# Patient Record
Sex: Male | Born: 1970 | Race: White | Hispanic: No | Marital: Single | Smoking: Current every day smoker
Health system: Southern US, Community
[De-identification: ages and names within clinical notes are randomized; demographics above are authoritative.]

## PROBLEM LIST (undated history)

## (undated) DIAGNOSIS — M5030 Other cervical disc degeneration, unspecified cervical region: Secondary | ICD-10-CM

## (undated) DIAGNOSIS — F419 Anxiety disorder, unspecified: Secondary | ICD-10-CM

## (undated) DIAGNOSIS — M1990 Unspecified osteoarthritis, unspecified site: Secondary | ICD-10-CM

## (undated) DIAGNOSIS — G4700 Insomnia, unspecified: Secondary | ICD-10-CM

## (undated) DIAGNOSIS — K759 Inflammatory liver disease, unspecified: Secondary | ICD-10-CM

## (undated) HISTORY — PX: NO PAST SURGERIES: SHX2092

---

## 2015-06-20 ENCOUNTER — Emergency Department (HOSPITAL_COMMUNITY)
Admission: EM | Admit: 2015-06-20 | Discharge: 2015-06-20 | Disposition: A | Discharge status: Other Acute Inpatient Hospital | Payer: No Typology Code available for payment source | Attending: Emergency Medicine | Admitting: Emergency Medicine

## 2015-06-20 ENCOUNTER — Encounter (HOSPITAL_COMMUNITY): Payer: Self-pay | Admitting: *Deleted

## 2015-06-20 ENCOUNTER — Inpatient Hospital Stay (HOSPITAL_COMMUNITY)
Admission: AD | Admit: 2015-06-20 | Discharge: 2015-06-22 | DRG: 885 | Disposition: A | Discharge status: Home / Self Care | Payer: Federal, State, Local not specified - Other | Source: Intra-hospital | Attending: Psychiatry | Admitting: Psychiatry

## 2015-06-20 ENCOUNTER — Emergency Department (HOSPITAL_COMMUNITY): Payer: No Typology Code available for payment source

## 2015-06-20 ENCOUNTER — Encounter (HOSPITAL_COMMUNITY): Payer: Self-pay | Admitting: Emergency Medicine

## 2015-06-20 DIAGNOSIS — S0990XA Unspecified injury of head, initial encounter: Secondary | ICD-10-CM | POA: Diagnosis present

## 2015-06-20 DIAGNOSIS — S40211A Abrasion of right shoulder, initial encounter: Secondary | ICD-10-CM | POA: Diagnosis not present

## 2015-06-20 DIAGNOSIS — F10929 Alcohol use, unspecified with intoxication, unspecified: Secondary | ICD-10-CM

## 2015-06-20 DIAGNOSIS — F10129 Alcohol abuse with intoxication, unspecified: Secondary | ICD-10-CM | POA: Diagnosis present

## 2015-06-20 DIAGNOSIS — F1010 Alcohol abuse, uncomplicated: Secondary | ICD-10-CM | POA: Insufficient documentation

## 2015-06-20 DIAGNOSIS — Y9389 Activity, other specified: Secondary | ICD-10-CM | POA: Insufficient documentation

## 2015-06-20 DIAGNOSIS — F1994 Other psychoactive substance use, unspecified with psychoactive substance-induced mood disorder: Secondary | ICD-10-CM | POA: Diagnosis present

## 2015-06-20 DIAGNOSIS — Y998 Other external cause status: Secondary | ICD-10-CM | POA: Diagnosis not present

## 2015-06-20 DIAGNOSIS — F329 Major depressive disorder, single episode, unspecified: Secondary | ICD-10-CM | POA: Insufficient documentation

## 2015-06-20 DIAGNOSIS — F332 Major depressive disorder, recurrent severe without psychotic features: Secondary | ICD-10-CM | POA: Diagnosis not present

## 2015-06-20 DIAGNOSIS — Z915 Personal history of self-harm: Secondary | ICD-10-CM

## 2015-06-20 DIAGNOSIS — F32A Depression, unspecified: Secondary | ICD-10-CM

## 2015-06-20 DIAGNOSIS — F17210 Nicotine dependence, cigarettes, uncomplicated: Secondary | ICD-10-CM | POA: Diagnosis present

## 2015-06-20 DIAGNOSIS — Y92410 Unspecified street and highway as the place of occurrence of the external cause: Secondary | ICD-10-CM | POA: Diagnosis not present

## 2015-06-20 DIAGNOSIS — Y908 Blood alcohol level of 240 mg/100 ml or more: Secondary | ICD-10-CM | POA: Diagnosis present

## 2015-06-20 DIAGNOSIS — X838XXA Intentional self-harm by other specified means, initial encounter: Secondary | ICD-10-CM

## 2015-06-20 LAB — COMPREHENSIVE METABOLIC PANEL
ALT: 20 U/L (ref 17–63)
AST: 27 U/L (ref 15–41)
Albumin: 4.4 g/dL (ref 3.5–5.0)
Alkaline Phosphatase: 57 U/L (ref 38–126)
Anion gap: 11 (ref 5–15)
BUN: 7 mg/dL (ref 6–20)
CHLORIDE: 105 mmol/L (ref 101–111)
CO2: 22 mmol/L (ref 22–32)
CREATININE: 0.75 mg/dL (ref 0.61–1.24)
Calcium: 8.8 mg/dL — ABNORMAL LOW (ref 8.9–10.3)
Glucose, Bld: 98 mg/dL (ref 65–99)
POTASSIUM: 3.5 mmol/L (ref 3.5–5.1)
SODIUM: 138 mmol/L (ref 135–145)
Total Bilirubin: 0.5 mg/dL (ref 0.3–1.2)
Total Protein: 7.1 g/dL (ref 6.5–8.1)

## 2015-06-20 LAB — URINALYSIS, ROUTINE W REFLEX MICROSCOPIC
Bilirubin Urine: NEGATIVE
GLUCOSE, UA: NEGATIVE mg/dL
Hgb urine dipstick: NEGATIVE
KETONES UR: NEGATIVE mg/dL
LEUKOCYTES UA: NEGATIVE
NITRITE: NEGATIVE
PH: 5.5 (ref 5.0–8.0)
Protein, ur: NEGATIVE mg/dL
SPECIFIC GRAVITY, URINE: 1.002 — AB (ref 1.005–1.030)
Urobilinogen, UA: 0.2 mg/dL (ref 0.0–1.0)

## 2015-06-20 LAB — CBC WITH DIFFERENTIAL/PLATELET
BASOS ABS: 0.1 10*3/uL (ref 0.0–0.1)
Basophils Relative: 1 % (ref 0–1)
EOS PCT: 4 % (ref 0–5)
Eosinophils Absolute: 0.2 10*3/uL (ref 0.0–0.7)
HEMATOCRIT: 42.3 % (ref 39.0–52.0)
Hemoglobin: 15.2 g/dL (ref 13.0–17.0)
LYMPHS ABS: 1.6 10*3/uL (ref 0.7–4.0)
LYMPHS PCT: 27 % (ref 12–46)
MCH: 32.2 pg (ref 26.0–34.0)
MCHC: 35.9 g/dL (ref 30.0–36.0)
MCV: 89.6 fL (ref 78.0–100.0)
Monocytes Absolute: 0.5 10*3/uL (ref 0.1–1.0)
Monocytes Relative: 8 % (ref 3–12)
NEUTROS ABS: 3.7 10*3/uL (ref 1.7–7.7)
Neutrophils Relative %: 60 % (ref 43–77)
PLATELETS: 194 10*3/uL (ref 150–400)
RBC: 4.72 MIL/uL (ref 4.22–5.81)
RDW: 13.6 % (ref 11.5–15.5)
WBC: 6 10*3/uL (ref 4.0–10.5)

## 2015-06-20 LAB — RAPID URINE DRUG SCREEN, HOSP PERFORMED
AMPHETAMINES: NOT DETECTED
BARBITURATES: NOT DETECTED
BENZODIAZEPINES: NOT DETECTED
COCAINE: NOT DETECTED
Opiates: NOT DETECTED
TETRAHYDROCANNABINOL: NOT DETECTED

## 2015-06-20 LAB — ETHANOL: ALCOHOL ETHYL (B): 258 mg/dL — AB (ref <5)

## 2015-06-20 MED ORDER — TRAZODONE HCL 50 MG PO TABS
50.0000 mg | ORAL_TABLET | Freq: Every evening | ORAL | Status: DC | PRN
  Administered 2015-06-20 – 2015-06-21 (×2): 50 mg via ORAL
  Filled 2015-06-20 (×2): qty 1

## 2015-06-20 MED ORDER — NICOTINE 21 MG/24HR TD PT24
21.0000 mg | MEDICATED_PATCH | Freq: Every day | TRANSDERMAL | Status: DC
  Administered 2015-06-20: 21 mg via TRANSDERMAL
  Filled 2015-06-20 (×2): qty 1

## 2015-06-20 MED ORDER — ACETAMINOPHEN 325 MG PO TABS: 650.0000 mg | ORAL_TABLET | ORAL | Status: DC | PRN

## 2015-06-20 MED ORDER — ONDANSETRON HCL 4 MG PO TABS: 4.0000 mg | ORAL_TABLET | Freq: Three times a day (TID) | ORAL | Status: DC | PRN

## 2015-06-20 MED ORDER — THIAMINE HCL 100 MG/ML IJ SOLN: 100.0000 mg | Freq: Every day | INTRAMUSCULAR | Status: DC

## 2015-06-20 MED ORDER — BUTALBITAL-APAP-CAFFEINE 50-325-40 MG PO TABS
2.0000 | ORAL_TABLET | ORAL | Status: DC | PRN
  Filled 2015-06-20: qty 2

## 2015-06-20 MED ORDER — ALUM & MAG HYDROXIDE-SIMETH 200-200-20 MG/5ML PO SUSP: 30.0000 mL | ORAL | Status: DC | PRN

## 2015-06-20 MED ORDER — IBUPROFEN 400 MG PO TABS
600.0000 mg | ORAL_TABLET | Freq: Three times a day (TID) | ORAL | Status: DC | PRN
  Administered 2015-06-20: 600 mg via ORAL
  Filled 2015-06-20 (×2): qty 1

## 2015-06-20 MED ORDER — LORAZEPAM 1 MG PO TABS: 0.0000 mg | ORAL_TABLET | Freq: Two times a day (BID) | ORAL | Status: DC

## 2015-06-20 MED ORDER — IBUPROFEN 600 MG PO TABS
600.0000 mg | ORAL_TABLET | Freq: Four times a day (QID) | ORAL | Status: DC | PRN
  Administered 2015-06-20 – 2015-06-21 (×4): 600 mg via ORAL
  Filled 2015-06-20 (×4): qty 1

## 2015-06-20 MED ORDER — MAGNESIUM HYDROXIDE 400 MG/5ML PO SUSP: 30.0000 mL | Freq: Every day | ORAL | Status: DC | PRN

## 2015-06-20 MED ORDER — LORAZEPAM 1 MG PO TABS
0.0000 mg | ORAL_TABLET | Freq: Four times a day (QID) | ORAL | Status: DC
  Filled 2015-06-20: qty 1

## 2015-06-20 MED ORDER — VITAMIN B-1 100 MG PO TABS: 100.0000 mg | ORAL_TABLET | Freq: Every day | ORAL | Status: DC

## 2015-06-20 MED ORDER — IBUPROFEN 400 MG PO TABS
600.0000 mg | ORAL_TABLET | Freq: Once | ORAL | Status: DC
  Filled 2015-06-20: qty 1
  Filled 2015-06-20: qty 3
  Filled 2015-06-20: qty 1

## 2015-06-20 NOTE — ED Notes (Signed)
This RN has called [facility] Office to arrange transport of patient to [facility].

## 2015-06-20 NOTE — ED Notes (Signed)
Patient appears to be asleep; sitter at bedside 
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
## 2015-06-20 NOTE — ED Notes (Signed)
Sitter has returned from break; patient laying on Human resources officer television; sitter at bedside

## 2015-06-20 NOTE — ED Notes (Signed)
Patient denied any pain medication.

## 2015-06-20 NOTE — ED Notes (Signed)
TTS at bedside. 

## 2015-06-20 NOTE — ED Notes (Signed)
Patient is now back in room; again yelling at sitter; [employer] now speaking with patient

## 2015-06-20 NOTE — ED Notes (Signed)
Sister just left from visiting with patient; patient sitting on stretcher, watching television; sitter at bedside

## 2015-06-20 NOTE — ED Notes (Signed)
Pt refused to cooperate with VS with the NT.  This RN went to bedside with security and [employer] available in room.This RN advised pt that cooperation would be appreciated.  This RN advised pt that VS are required q 6h and not optional.  Pt allowed this RN to take VS.  When asked pain score pt replied, "It doesn't matter anyway."  This RN thanked pt for cooperating.

## 2015-06-20 NOTE — ED Notes (Signed)
Lunch tray has arrived; sitter at bedside 

## 2015-06-20 NOTE — ED Notes (Signed)
Patient given snacks and a cup of coke with ice to drink

## 2015-06-20 NOTE — ED Notes (Signed)
Patient sitting on stretcher; sitter at bedside

## 2015-06-20 NOTE — ED Notes (Signed)
Marylu Lund from [facility] called to inform this RN that this patient has been accepted to [facility], 307 bed 2 by Dr. Dub Mikes. The pt and Dr. Ethelda Chick informed.

## 2015-06-20 NOTE — ED Notes (Signed)
TTS set up at bedside. 

## 2015-06-20 NOTE — Progress Notes (Addendum)
Seeking inpatient placement for pt. Also considered for admission to [facility] upon bed availability.  Referred to: [facility]- per Karoline Caldwell, at capacity currently but can review for waiting list [place]- per [facility]- per Dahlia Client, at capacity currently but will call back if beds become available [place]- per Albin Felling  At capacity: [facility]- for IPRS beds per [facility] Minda Meo (only able to accept referrals for females on adult unit until further notice) [facility]  Ilean Skill, MSW, LCSW Clinical Social Work, Disposition  06/20/2015 [phone]
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
## 2015-06-20 NOTE — ED Notes (Signed)
Pt refusing VS - requesting to speak with EDP. EDP aware.

## 2015-06-20 NOTE — ED Notes (Signed)
Darl Pikes, NT (float pool) relieving sitter for a lunch break

## 2015-06-20 NOTE — ED Notes (Signed)
Patient given snacks and a cup of ice with soda

## 2015-06-20 NOTE — ED Notes (Signed)
Patient woke up yelling at the sitter wanting to call his sister or brother-in-law; patient yelling using profanity and pushes his breakfast tray towards the sitter; [employer] now at the door; [facility], Charity fundraiser at bedside speaking with patient with sitter and [employer] at bedside

## 2015-06-20 NOTE — ED Notes (Signed)
Dalene Seltzer, MD now at bedside speaking with patient; [employer] and sitter at bedside

## 2015-06-20 NOTE — ED Notes (Signed)
Dinner tray delivered. Pt eating dinner. Pt aware of upcoming transport to [facility] and is calm and cooperative.

## 2015-06-20 NOTE — ED Notes (Signed)
Patient laying on stretcher, watching television; sitter at bedside 

## 2015-06-20 NOTE — Tx Team (Signed)
Initial Interdisciplinary Treatment Plan   PATIENT STRESSORS: Legal issue Occupational concerns   PATIENT STRENGTHS: Active sense of humor Average or above average intelligence Motivation for treatment/growth Supportive family/friends Work skills   PROBLEM LIST: Problem List/Patient Goals Date to be addressed Date deferred Reason deferred Estimated date of resolution  Depression Suicidal ideation (Pt adamently denies) 06/20/15     "I want to get better" 06/20/15                                                DISCHARGE CRITERIA:  Improved stabilization in mood, thinking, and/or behavior  PRELIMINARY DISCHARGE PLAN: Return to previous living arrangement Return to previous work or school arrangements  PATIENT/FAMIILY INVOLVEMENT: This treatment plan has been presented to and reviewed with the patient, Donald Hensley.  The patient and family have been given the opportunity to ask questions and make suggestions.  Juliann Pares 06/20/2015, 8:52 PM

## 2015-06-20 NOTE — ED Notes (Signed)
Pt c/o severe headache and requesting advil. This RN brought advil to pt. Pt states "I do not want that ibuprofen - ibuprofen is not advil." This RN explained that they are the same medication, just different names. Pt states "Don't talk to me like I am stupid." Pt refuses medication.

## 2015-06-20 NOTE — ED Notes (Signed)
Spoke with sister in person about patient's care after patient verbalized consent. Patient and family member discussed care and plan of care. Patient verbalized consent. Patient verbalized consent to give sister patient's two citations and blood work paperwork.   Patient verbalized consent for sister to know all medical and care information. Decided passcode to verify sister's identity.   PASSCODE: [id number]  SISTER'S NAME: STARRLETT Ciaramitaro   CELL NUMBER: [phone] ALT: [phone] FIANCE'S NUMBER
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
## 2015-06-20 NOTE — ED Notes (Signed)
TTS staff reports "pt not cooperating, cannot complete my assessment"; staff requests that patient sleep some and TTS again in the morning; MD Norlene Campbell made aware and agrees with plan
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
## 2015-06-20 NOTE — ED Notes (Signed)
Patient is at desk now using the phone; sitter is with him and [employer] is standing by
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
## 2015-06-20 NOTE — Progress Notes (Signed)
Pt attended AA group this evening.  

## 2015-06-21 DIAGNOSIS — F10129 Alcohol abuse with intoxication, unspecified: Secondary | ICD-10-CM | POA: Diagnosis present

## 2015-06-21 DIAGNOSIS — F1994 Other psychoactive substance use, unspecified with psychoactive substance-induced mood disorder: Secondary | ICD-10-CM | POA: Diagnosis present

## 2015-06-21 DIAGNOSIS — F332 Major depressive disorder, recurrent severe without psychotic features: Principal | ICD-10-CM

## 2015-06-21 LAB — TSH: TSH: 1.404 u[IU]/mL (ref 0.350–4.500)

## 2015-06-21 MED ORDER — NICOTINE 21 MG/24HR TD PT24
21.0000 mg | MEDICATED_PATCH | Freq: Every day | TRANSDERMAL | Status: DC
  Administered 2015-06-21 – 2015-06-22 (×2): 21 mg via TRANSDERMAL
  Filled 2015-06-21 (×4): qty 1

## 2015-06-21 NOTE — Plan of Care (Signed)
Problem: Ineffective individual coping Goal: STG: Patient will remain free from self harm Outcome: Progressing Patient has not engaged in self harm and denies SI  Problem: Diagnosis: Increased Risk For Suicide Attempt Goal: STG-Patient Will Comply With Medication Regime Outcome: Progressing Patient med compliant.     

## 2015-06-21 NOTE — BHH Group Notes (Signed)
BHH Group Notes:  (Nursing/MHT/Case Management/Adjunct)  Date:  06/21/2015  Time:  0900   Type of Therapy:  Nurse Education  Participation Level:  Active  Participation Quality:  Appropriate  Affect:  Appropriate  Cognitive:  Alert  Insight:  Improving  Engagement in Group:  Improving  Modes of Intervention:  Education and Support  Summary of Progress/Problems: Patient attended and participated in nurse ed group. Shared his goal for today is to better understand the mistake that got him here. Long term he would like to like to get his own place, attend AA and church.  Merian Capron [facility] 06/21/2015, 0930

## 2015-06-21 NOTE — BHH Group Notes (Signed)
BHH Group Notes:  (Clinical Social Work)  06/21/2015     10-11AM  Summary of Progress/Problems:   The main focus of today's process group was to learn how to use a decisional balance exercise to move forward in the Stages of Change, which were described and discussed.  Patients listed needs on the whiteboard and unhealthy coping techniques often used to fill needs.  Motivational Interviewing and the whiteboard were utilized to help patients explore in depth the perceived benefits and costs of unhealthy coping techniques, as well as the  benefits and costs of replacing that with a healthy coping skills.  A handout was distributed for patients to be able to do this exercise for themselves.   The patient expressed that his own unhealthy coping involves drinking, adding that he used to use cocaine heavily.  He expressed himself appropriately throughout group, was insightful.  Type of Therapy:  Group Therapy - Process   Participation Level:  Active  Participation Quality:  Appropriate, Attentive, Sharing and Supportive  Affect:  Blunted  Cognitive:  Alert, Appropriate and Oriented  Insight:  Engaged  Engagement in Therapy:  Engaged  Modes of Intervention:  Education, Motivational Interviewing  Ambrose Mantle, LCSW 06/21/2015, 12:42 PM

## 2015-06-21 NOTE — Progress Notes (Signed)
Patient did attend the evening speaker AA meeting.  
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
## 2015-06-21 NOTE — Plan of Care (Signed)
Problem: Diagnosis: Increased Risk For Suicide Attempt Goal: STG-Patient Will Attend All Groups On The Unit Outcome: Progressing Pt has attended all groups

## 2015-06-21 NOTE — Progress Notes (Signed)
D.  Pt pleasant on approach, denies complaints other than some back and neck pain that Pt states is because he "twisted it" earlier today.  Positive for evening AA group, interacting appropriately with peers on the unit.  Denies SI/HI/hallucinations at this time.  Hopeful for discharge tomorrow so that he may start work on Monday at his new job.  A.  Support and encouragement offered, medication given as ordered  R.  Pt remains safe on the unit, will continue to monitor.
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
## 2015-06-21 NOTE — Progress Notes (Addendum)
Patient up and visible in the milieu. Interacting appropriately with staff and peers. Spoke of making a "stupid choice that got him here." "I am not an alcoholic but I plan to attend AA. I just think it will be good for me." Patient's mood pleasant, affect appropriate. Rates depression, hopelessness and anxiety all at a 0/10. Medicated per orders. Support and reassurance offered. Denies SI/HI and remains safe. Donald Hensley
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
## 2015-06-22 MED ORDER — MULTI-VITAMIN/MINERALS PO TABS: 1.0000 | ORAL_TABLET | Freq: Every day | ORAL | Status: AC

## 2015-06-22 MED ORDER — TRAZODONE HCL 50 MG PO TABS
50.0000 mg | ORAL_TABLET | Freq: Every evening | ORAL | Status: AC | PRN
Start: 2015-06-22 — End: ?

## 2015-06-22 MED ORDER — NICOTINE 21 MG/24HR TD PT24: 21.0000 mg | MEDICATED_PATCH | Freq: Every day | TRANSDERMAL | Status: AC

## 2015-06-22 NOTE — Progress Notes (Signed)
Observed patient sitting in dayroom, interacting with peers. Continues to deny any issues, psychiatrically or otherwise. Rates depression, hopelessness and anxiety all at a 0/10. States back pain has resolved. Patient hopeful for discharge. Patient offered support, no scheduled meds, no request for prn's. Denies SI/HI and remains safe. Lawrence Marseilles
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
## 2015-06-22 NOTE — [facility] Group Notes (Signed)
[facility] Group Notes:  (Clinical Social Work)  06/22/2015  10:00-11:00AM  Summary of Progress/Problems:   The main focus of today's process group was to   1)  discuss the importance of adding supports  2)  define health supports versus unhealthy supports  3)  identify the patient's current unhealthy supports and plan how to handle them  4)  Identify the patient's current healthy supports and plan what to add.  An emphasis was placed on using counselor, doctor, therapy groups, 12-step groups, and problem-specific support groups to expand supports.    The patient expressed full comprehension of the concepts presented, and agreed that there is a need to add more supports.  The patient stated that he has a large number of supports in his life including his sister, brother-in-law, son and 2 daughters, and more.  He was very supportive of his roommate and others throughout group.  He talked about wanting to get involved in AA possibly.  He was given a weekly schedule for local meetings.  Type of Therapy:  Process Group with Motivational Interviewing  Participation Level:  Active  Participation Quality:  Appropriate, Attentive, Sharing and Supportive  Affect:  Appropriate  Cognitive:  Alert, Appropriate and Oriented  Insight:  Engaged  Engagement in Therapy:  Engaged  Modes of Intervention:   Education, Support and Processing, Activity  Ambrose Mantle, LCSW 06/22/2015

## 2015-06-22 NOTE — [facility] Suicide Risk Assessment (Signed)
[facility] INPATIENT:  Family/Significant Other Suicide Prevention Education  Suicide Prevention Education:  Education Completed; Donald Hensley (sis) [phone] has been identified by the patient as the family member/significant other with whom the patient will be residing, and identified as the person(s) who will aid the patient in the event of a mental health crisis (suicidal ideations/suicide attempt).  With written consent from the patient, the family member/significant other has been provided the following suicide prevention education, prior to the and/or following the discharge of the patient.  The suicide prevention education provided includes the following:  Suicide risk factors  Suicide prevention and interventions  National Suicide Hotline telephone number  [facility] assessment telephone number  [facility] Emergency Assistance 911  [facility] and/or Residential Mobile Crisis Unit telephone number  Request made of family/significant other to:  Remove weapons (e.g., guns, rifles, knives), all items previously/currently identified as safety concern.    Remove drugs/medications (over-the-counter, prescriptions, illicit drugs), all items previously/currently identified as a safety concern.  The family member/significant other verbalizes understanding of the suicide prevention education information provided.  The family member/significant other agrees to remove the items of safety concern listed above.  There are firearms locked up in the home, with ammunition removed and stored elsewhere locked up.  Sister's fiance is a Chief Financial Officer and has secured these items, which they state patient does not know about.  Sister has been talking to patient and states she has told him that he has disappointed them, and hopes this "woke him up."  She feels he is safe to return home, and in fact the job tomorrow morning is very important for him to keep.  Donald Hensley 06/22/2015, 3:41 PM

## 2015-06-22 NOTE — Progress Notes (Signed)
Patient packed and eager for discharge. Teaching explained, Rx given and all belongings returned.  Per his choice, patient to travel home via [facility] taxi for which he is assuming financial responsibility (flat rate of $28). Patient verbalized understanding. Discharged in stable condition. Lawrence Marseilles

## 2015-06-22 NOTE — [facility] Group Notes (Signed)
[facility] Group Notes:  (Nursing/MHT/Case Management/Adjunct)  Date:  06/22/2015  Time:  1300  Type of Therapy:  Nurse Education  Participation Level:  Active  Participation Quality:  Attentive  Affect:  Appropriate  Cognitive:  Appropriate  Insight:  Improving  Engagement in Group:  Engaged  Modes of Intervention:  Education and Support  Summary of Progress/Problems: patient attended and participated in nursing ed group.  Merian [facility] 06/22/2015, 1330

## 2015-06-22 NOTE — [facility] Suicide Risk Assessment (Signed)
[facility] Discharge Suicide Risk Assessment   Demographic Factors:  Male  Total Time spent with patient: 30 minutes  Musculoskeletal: Strength & Muscle Tone: within normal limits Gait & Station: normal Patient leans: normal  Psychiatric Specialty Exam: Physical Exam  Review of Systems  Constitutional: Negative.   HENT: Negative.   Eyes: Negative.   Respiratory: Negative.   Cardiovascular: Negative.   Gastrointestinal: Negative.   Genitourinary: Negative.   Musculoskeletal: Negative.   Skin: Negative.   Neurological: Negative.   Endo/Heme/Allergies: Negative.   Psychiatric/Behavioral: Positive for substance abuse.    Blood pressure 122/86, pulse 81, temperature 97.9 F (36.6 C), temperature source Oral, resp. rate 16, height  (1.549 m), weight 53.524 kg (118 lb).Body mass index is 22.31 kg/(m^2).  General Appearance: Fairly Groomed  Patent attorney::  Fair  Speech:  Clear and Coherent409  Volume:  Normal  Mood:  Euthymic  Affect:  Appropriate  Thought Process:  Coherent and Goal Directed  Orientation:  Full (Time, Place, and Person)  Thought Content:  symptoms events worries concerns  Suicidal Thoughts:  No  Homicidal Thoughts:  No  Memory:  Immediate;   Fair Recent;   Fair Remote;   Fair  Judgement:  Fair  Insight:  Present  Psychomotor Activity:  Normal  Concentration:  Fair  Recall:  [employer] of Knowledge:Fair  Language: Fair  Akathisia:  No  Handed:  Right  AIMS (if indicated):     Assets:  Desire for Improvement Housing Vocational/Educational  Sleep:  Number of Hours: 6  Cognition: WNL  ADL's:  Intact   Have you used any form of tobacco in the last 30 days? (Cigarettes, Smokeless Tobacco, Cigars, and/or Pipes): Yes  Has this patient used any form of tobacco in the last 30 days? (Cigarettes, Smokeless Tobacco, Cigars, and/or Pipes) Yes, Prescription not provided because: nicotine patches will be given  Mental Status Per Nursing Assessment::   On  Admission:  Suicidal ideation indicated by others  Current Mental Status by Physician: In full contact with reality. There are no active SI plans or intent. There are no active S/S of withdrawal. He is starting his new job in the morning. He wants to be out of here so he can be at work tomorrow AM. He states he will not drink like he did again. States he accepts he did wrong and now he is willing and ready to pay for the consequences.    Loss Factors: Legal issues  Historical Factors: NA  Risk Reduction Factors:   Employed, Living with another person, especially a relative and Positive social support  Continued Clinical Symptoms:  Alcohol/Substance Abuse/Dependencies  Cognitive Features That Contribute To Risk:  Closed-mindedness, Polarized thinking and Thought constriction (tunnel vision)    Suicide Risk:  Minimal: No identifiable suicidal ideation.  Patients presenting with no risk factors but with morbid ruminations; may be classified as minimal risk based on the severity of the depressive symptoms  Principal Problem: Substance induced mood disorder Discharge Diagnoses:  Patient Active Problem List   Diagnosis Date Noted  . Alcohol abuse with intoxication [F10.129] 06/21/2015  . Substance induced mood disorder [F19.94] 06/21/2015  . Major depressive disorder, recurrent episode, severe [F33.2] 06/20/2015    Follow-up Information    Follow up with [facility].   Specialty:  Behavioral Health   Why:  Go to [facility] any day Monday-Friday 8am-3pm for assessment, treatment planning, and doctor evaluation.   Contact information:   [address] [place] [address]  [phone]       Plan Of Care/Follow-up recommendations:  Activity:  as tolerated Diet:  regular Follow up as above Is patient on multiple antipsychotic therapies at discharge:  No   Has Patient had three or more failed trials of antipsychotic monotherapy by history:  No  Recommended Plan for Multiple  Antipsychotic Therapies: NA    Jerlisa Diliberto A 06/22/2015, 4:05 PM

## 2015-06-22 NOTE — Progress Notes (Signed)
  [facility] Adult Case Management Discharge Plan :  Will you be returning to the same living situation after discharge:  Yes,  with sister and brother-in-law At discharge, do you have transportation home?: Yes,  will take taxi or uber Do you have the ability to pay for your medications: Yes,  but not on medications at d/c  Release of information consent forms completed and in the chart;  Patient's signature needed at discharge.  Patient to Follow up at: Follow-up Information    Follow up with [facility].   Specialty:  Behavioral Health   Why:  Go to [facility] any day Monday-Friday 8am-3pm for assessment, treatment planning, and doctor evaluation.   Contact information:   [address] [place] [address] [phone]       Patient denies SI/HI: Yes,  see doctor d/c note    Safety Planning and Suicide Prevention discussed: Yes,  with patient and with sister  Have you used any form of tobacco in the last 30 days? (Cigarettes, Smokeless Tobacco, Cigars, and/or Pipes): Yes  Has patient been referred to the Quitline?: Yes, faxed on 06/21/15  Sarina Ser 06/22/2015, 3:43 PM

## 2015-06-22 NOTE — Plan of Care (Signed)
Problem: Ineffective individual coping Goal: STG: Pt will be able to identify effective and ineffective STG: Pt will be able to identify effective and ineffective coping patterns  Outcome: Progressing Patient aware that drinking heavily was not a good decision. Discussed alternate and healthy coping skills.  Problem: Alteration in mood Goal: STG-Patient reports thoughts of self-harm to staff Outcome: Progressing Denying thoughts of self harm, SI

## 2015-06-23 LAB — HEMOGLOBIN A1C
HEMOGLOBIN A1C: 5.4 % (ref 4.8–5.6)
MEAN PLASMA GLUCOSE: 108 mg/dL

## 2016-03-24 ENCOUNTER — Other Ambulatory Visit: Payer: Self-pay

## 2016-06-17 ENCOUNTER — Encounter
Admission: RE | Admit: 2016-06-17 | Discharge: 2016-06-17 | Disposition: A | Discharge status: Home / Self Care | Payer: Self-pay | Source: Ambulatory Visit | Attending: Orthopedic Surgery | Admitting: Orthopedic Surgery

## 2016-06-17 DIAGNOSIS — Z01818 Encounter for other preprocedural examination: Secondary | ICD-10-CM | POA: Insufficient documentation

## 2016-06-17 DIAGNOSIS — G5602 Carpal tunnel syndrome, left upper limb: Secondary | ICD-10-CM | POA: Insufficient documentation

## 2016-06-17 HISTORY — DX: Unspecified osteoarthritis, unspecified site: M19.90

## 2016-06-17 HISTORY — DX: Insomnia, unspecified: G47.00

## 2016-06-17 HISTORY — DX: Inflammatory liver disease, unspecified: K75.9

## 2016-06-17 HISTORY — DX: Anxiety disorder, unspecified: F41.9

## 2016-06-17 HISTORY — DX: Other cervical disc degeneration, unspecified cervical region: M50.30

## 2016-06-17 NOTE — Patient Instructions (Signed)
  Your procedure is scheduled on: June 18, 2016 (Friday) Report to Same Day Surgery 2nd floor Medical Mall  To find out your arrival time please call [phone] between 1PM - 3PM on June 17, 2016 (Thursday)  Remember: Instructions that are not followed completely may result in serious medical risk, up to and including death, or upon the discretion of your surgeon and anesthesiologist your surgery may need to be rescheduled.    _x___ 1. Do not eat food or drink liquids after midnight. No gum chewing or hard candies.     _x___ 2. No Alcohol for 24 hours before or after surgery.   _x__3. No Smoking for 24 prior to surgery.   ____  4. Bring all medications with you on the day of surgery if instructed.    __x__ 5. Notify your doctor if there is any change in your medical condition     (cold, fever, infections).     Do not wear jewelry, make-up, hairpins, clips or nail polish.  Do not wear lotions, powders, or perfumes. You may wear deodorant.  Do not shave 48 hours prior to surgery. Men may shave face and neck.  Do not bring valuables to the hospital.    [facility] is not responsible for any belongings or valuables.               Contacts, dentures or bridgework may not be worn into surgery.  Leave your suitcase in the car. After surgery it may be brought to your room.  For patients admitted to the hospital, discharge time is determined by your treatment team.   Patients discharged the day of surgery will not be allowed to drive home.    Please read over the following fact sheets that you were given:   [facility] Preparing for Surgery and or MRSA Information   ____ Take these medicines the morning of surgery with A SIP OF WATER:    1.   2.  3.  4.  5.  6.  ____ Fleet Enema (as directed)   _x___ Use CHG Soap or sage wipes as directed on instruction sheet   ____ Use inhalers on the day of surgery and bring to hospital day of surgery  ____ Stop metformin 2 days prior  to surgery    ____ Take 1/2 of usual insulin dose the night before surgery and none on the morning of surgery           _x___ Stop aspirin or coumadin, or plavix (NO ASPIRIN)  _x__ Stop Anti-inflammatories such as Advil, Aleve, Ibuprofen, Motrin, Naproxen,          Naprosyn, Goodies powders or aspirin products. Ok to take Tylenol.or may take Tramadol if needed   ____ Stop supplements until after surgery.    ____ Bring C-Pap to the hospital.

## 2016-06-18 ENCOUNTER — Encounter
Admission: RE | Disposition: A | Discharge status: Home / Self Care | Payer: Self-pay | Source: Ambulatory Visit | Attending: Orthopedic Surgery

## 2016-06-18 ENCOUNTER — Ambulatory Visit: Payer: Worker's Compensation | Admitting: Certified Registered"

## 2016-06-18 ENCOUNTER — Encounter: Payer: Self-pay | Admitting: *Deleted

## 2016-06-18 ENCOUNTER — Ambulatory Visit
Admission: RE | Admit: 2016-06-18 | Discharge: 2016-06-18 | Disposition: A | Discharge status: Home / Self Care | Payer: Worker's Compensation | Source: Ambulatory Visit | Attending: Orthopedic Surgery | Admitting: Orthopedic Surgery

## 2016-06-18 DIAGNOSIS — M479 Spondylosis, unspecified: Secondary | ICD-10-CM | POA: Insufficient documentation

## 2016-06-18 DIAGNOSIS — G5602 Carpal tunnel syndrome, left upper limb: Secondary | ICD-10-CM | POA: Diagnosis not present

## 2016-06-18 DIAGNOSIS — F17200 Nicotine dependence, unspecified, uncomplicated: Secondary | ICD-10-CM | POA: Diagnosis not present

## 2016-06-18 DIAGNOSIS — M7712 Lateral epicondylitis, left elbow: Secondary | ICD-10-CM | POA: Insufficient documentation

## 2016-06-18 DIAGNOSIS — Z79891 Long term (current) use of opiate analgesic: Secondary | ICD-10-CM | POA: Insufficient documentation

## 2016-06-18 DIAGNOSIS — B1920 Unspecified viral hepatitis C without hepatic coma: Secondary | ICD-10-CM | POA: Diagnosis not present

## 2016-06-18 DIAGNOSIS — G4700 Insomnia, unspecified: Secondary | ICD-10-CM | POA: Diagnosis not present

## 2016-06-18 DIAGNOSIS — Z880 Allergy status to penicillin: Secondary | ICD-10-CM | POA: Insufficient documentation

## 2016-06-18 HISTORY — PX: CARPAL TUNNEL RELEASE: SHX101

## 2016-06-18 HISTORY — PX: TENNIS ELBOW RELEASE/NIRSCHEL PROCEDURE: SHX6651

## 2016-06-18 SURGERY — CARPAL TUNNEL RELEASE
Anesthesia: General | Laterality: Left | Wound class: Clean

## 2016-06-18 MED ORDER — LIDOCAINE HCL (CARDIAC) 20 MG/ML IV SOLN
INTRAVENOUS | Status: DC | PRN
  Administered 2016-06-18: 60 mg via INTRAVENOUS

## 2016-06-18 MED ORDER — PROPOFOL 10 MG/ML IV BOLUS
INTRAVENOUS | Status: DC | PRN
  Administered 2016-06-18: 50 mg via INTRAVENOUS
  Administered 2016-06-18: 150 mg via INTRAVENOUS

## 2016-06-18 MED ORDER — HYDROCODONE-ACETAMINOPHEN 5-325 MG PO TABS: 1.0000 | ORAL_TABLET | ORAL | Status: DC | PRN

## 2016-06-18 MED ORDER — HYDROCODONE-ACETAMINOPHEN 5-325 MG PO TABS
1.0000 | ORAL_TABLET | Freq: Four times a day (QID) | ORAL | Status: DC | PRN
Start: 2016-06-18 — End: 2016-06-18
  Administered 2016-06-18: 1 via ORAL

## 2016-06-18 MED ORDER — SODIUM CHLORIDE 0.9 % IV SOLN: INTRAVENOUS | Status: DC

## 2016-06-18 MED ORDER — ONDANSETRON HCL 4 MG/2ML IJ SOLN: 4.0000 mg | Freq: Once | INTRAMUSCULAR | Status: DC | PRN

## 2016-06-18 MED ORDER — FENTANYL CITRATE (PF) 100 MCG/2ML IJ SOLN
INTRAMUSCULAR | Status: AC
  Administered 2016-06-18: 25 ug via INTRAVENOUS
  Filled 2016-06-18: qty 2

## 2016-06-18 MED ORDER — BUPIVACAINE HCL (PF) 0.5 % IJ SOLN
INTRAMUSCULAR | Status: AC
  Filled 2016-06-18: qty 30

## 2016-06-18 MED ORDER — BUPIVACAINE HCL (PF) 0.5 % IJ SOLN
INTRAMUSCULAR | Status: DC | PRN
  Administered 2016-06-18 (×2): 10 mL

## 2016-06-18 MED ORDER — FENTANYL CITRATE (PF) 100 MCG/2ML IJ SOLN
25.0000 ug | INTRAMUSCULAR | Status: DC | PRN
  Administered 2016-06-18 (×4): 25 ug via INTRAVENOUS

## 2016-06-18 MED ORDER — CLINDAMYCIN PHOSPHATE 900 MG/50ML IV SOLN
INTRAVENOUS | Status: AC
  Filled 2016-06-18: qty 50

## 2016-06-18 MED ORDER — PHENYLEPHRINE HCL 10 MG/ML IJ SOLN
INTRAMUSCULAR | Status: DC | PRN
  Administered 2016-06-18: 100 ug via INTRAVENOUS
  Administered 2016-06-18: 200 ug via INTRAVENOUS
  Administered 2016-06-18: 100 ug via INTRAVENOUS

## 2016-06-18 MED ORDER — MIDAZOLAM HCL 2 MG/2ML IJ SOLN
INTRAMUSCULAR | Status: DC | PRN
  Administered 2016-06-18: 2 mg via INTRAVENOUS

## 2016-06-18 MED ORDER — LACTATED RINGERS IV SOLN
INTRAVENOUS | Status: DC
  Administered 2016-06-18: 10:00:00 via INTRAVENOUS

## 2016-06-18 MED ORDER — HYDROCODONE-ACETAMINOPHEN 5-325 MG PO TABS: 1.0000 | ORAL_TABLET | Freq: Four times a day (QID) | ORAL | 0 refills | Status: AC | PRN

## 2016-06-18 MED ORDER — HYDROCODONE-ACETAMINOPHEN 5-325 MG PO TABS
ORAL_TABLET | ORAL | Status: AC
  Filled 2016-06-18: qty 1

## 2016-06-18 MED ORDER — METOCLOPRAMIDE HCL 10 MG PO TABS: 5.0000 mg | ORAL_TABLET | Freq: Three times a day (TID) | ORAL | Status: DC | PRN

## 2016-06-18 MED ORDER — ONDANSETRON HCL 4 MG/2ML IJ SOLN
INTRAMUSCULAR | Status: DC | PRN
  Administered 2016-06-18: 4 mg via INTRAVENOUS

## 2016-06-18 MED ORDER — ONDANSETRON HCL 4 MG/2ML IJ SOLN: 4.0000 mg | Freq: Four times a day (QID) | INTRAMUSCULAR | Status: DC | PRN

## 2016-06-18 MED ORDER — FENTANYL CITRATE (PF) 100 MCG/2ML IJ SOLN
INTRAMUSCULAR | Status: DC | PRN
  Administered 2016-06-18: 50 ug via INTRAVENOUS

## 2016-06-18 MED ORDER — FAMOTIDINE 20 MG PO TABS
20.0000 mg | ORAL_TABLET | Freq: Once | ORAL | Status: AC
  Administered 2016-06-18: 20 mg via ORAL

## 2016-06-18 MED ORDER — ONDANSETRON HCL 4 MG PO TABS: 4.0000 mg | ORAL_TABLET | Freq: Four times a day (QID) | ORAL | Status: DC | PRN

## 2016-06-18 MED ORDER — DEXAMETHASONE SODIUM PHOSPHATE 10 MG/ML IJ SOLN
INTRAMUSCULAR | Status: DC | PRN
  Administered 2016-06-18: 10 mg via INTRAVENOUS

## 2016-06-18 MED ORDER — FAMOTIDINE 20 MG PO TABS
ORAL_TABLET | ORAL | Status: AC
  Administered 2016-06-18: 20 mg via ORAL
  Filled 2016-06-18: qty 1

## 2016-06-18 MED ORDER — CLINDAMYCIN PHOSPHATE 900 MG/50ML IV SOLN
900.0000 mg | Freq: Once | INTRAVENOUS | Status: AC
  Administered 2016-06-18: 900 mg via INTRAVENOUS

## 2016-06-18 MED ORDER — METOCLOPRAMIDE HCL 5 MG/ML IJ SOLN: 5.0000 mg | Freq: Three times a day (TID) | INTRAMUSCULAR | Status: DC | PRN

## 2016-06-18 SURGICAL SUPPLY — 34 items
BANDAGE ACE 3X5.8 VEL STRL LF (GAUZE/BANDAGES/DRESSINGS) ×3 IMPLANT
BANDAGE ELASTIC 4 LF NS (GAUZE/BANDAGES/DRESSINGS) ×3 IMPLANT
BNDG ESMARK 4X12 TAN STRL LF (GAUZE/BANDAGES/DRESSINGS) ×3 IMPLANT
CANISTER SUCT 1200ML W/VALVE (MISCELLANEOUS) ×3 IMPLANT
CAST PADDING 3X4FT ST 30246 (SOFTGOODS) ×2
CHLORAPREP W/TINT 26ML (MISCELLANEOUS) ×3 IMPLANT
CUFF TOURN 18 STER (MISCELLANEOUS) ×3 IMPLANT
DRAPE SURG 17X11 SM STRL (DRAPES) ×3 IMPLANT
ELECT CAUTERY NEEDLE 2.0 MIC (NEEDLE) IMPLANT
ELECT CAUTERY NEEDLE TIP 1.0 (MISCELLANEOUS)
ELECTRODE CAUTERY NEDL TIP 1.0 (MISCELLANEOUS) IMPLANT
GAUZE PETRO XEROFOAM 1X8 (MISCELLANEOUS) ×3 IMPLANT
GAUZE SPONGE 4X4 12PLY STRL (GAUZE/BANDAGES/DRESSINGS) ×3 IMPLANT
GLOVE BIOGEL PI IND STRL 9 (GLOVE) ×1 IMPLANT
GLOVE BIOGEL PI INDICATOR 9 (GLOVE) ×2
GLOVE SURG ORTHO 9.0 STRL STRW (GLOVE) ×3 IMPLANT
GOWN SRG 2XL LVL 4 RGLN SLV (GOWNS) ×1 IMPLANT
GOWN STRL NON-REIN 2XL LVL4 (GOWNS) ×2
GOWN STRL REUS W/ TWL LRG LVL3 (GOWN DISPOSABLE) ×1 IMPLANT
GOWN STRL REUS W/TWL 2XL LVL3 (GOWN DISPOSABLE) ×3 IMPLANT
GOWN STRL REUS W/TWL LRG LVL3 (GOWN DISPOSABLE) ×2
KIT RM TURNOVER STRD PROC AR (KITS) ×3 IMPLANT
NS IRRIG 500ML POUR BTL (IV SOLUTION) ×3 IMPLANT
PACK EXTREMITY ARMC (MISCELLANEOUS) ×3 IMPLANT
PAD CAST CTTN 3X4 STRL (SOFTGOODS) ×1 IMPLANT
PAD CAST CTTN 4X4 STRL (SOFTGOODS) ×1 IMPLANT
PADDING CAST COTTON 4X4 STRL (SOFTGOODS) ×2
SLING ARM S TX990203 (SOFTGOODS) ×3 IMPLANT
STOCKINETTE STRL 4IN 9604848 (GAUZE/BANDAGES/DRESSINGS) ×3 IMPLANT
SUT ETHILON 4-0 (SUTURE) ×4
SUT ETHILON 4-0 FS2 18XMFL BLK (SUTURE) ×2
SUT VIC AB 2-0 SH 27 (SUTURE) ×2
SUT VIC AB 2-0 SH 27XBRD (SUTURE) ×1 IMPLANT
SUTURE ETHLN 4-0 FS2 18XMF BLK (SUTURE) ×2 IMPLANT

## 2016-06-18 NOTE — H&P (Signed)
Reviewed paper H+P, will be scanned into chart. No changes noted.  

## 2016-06-18 NOTE — Op Note (Signed)
06/18/2016  12:48 PM  PATIENT:  Donald Hensley  45 y.o. male  PRE-OPERATIVE DIAGNOSIS:  carpal tunnel syndrome of left wrist, left lateral epicondylitis  POST-OPERATIVE DIAGNOSIS:  carpal tunnel syndrome of left wrist, left lateral epicondylitis  PROCEDURE:  Procedure(s): CARPAL TUNNEL RELEASE (Left) TENNIS ELBOW RELEASE/NIRSCHEL PROCEDURE (Left)  SURGEON: Leitha SchullerMichael J Abrar Bilton, MD  ASSISTANTS: None  ANESTHESIA:   general  EBL:  Total I/O In: 600 [I.V.:600] Out: 5 [Blood:5]  BLOOD ADMINISTERED:none  DRAINS: none   LOCAL MEDICATIONS USED:  MARCAINE     SPECIMEN:  No Specimen  DISPOSITION OF SPECIMEN:  N/A  COUNTS:  YES  TOURNIQUET:   30 minutes at 250 mmHg  IMPLANTS: None  DICTATION: .Dragon Dictation patient was brought to the operating room and after adequate general anesthesia was obtained the left arm was prepped and draped in sterile fashion. After patient identification and timeout procedures were completed the carpal tunnel was addressed first with a volar incision in line with ring metacarpal approximate 2 and half centimeters in length. Skin and subcutaneous tissue incised and the transverse carpal ligament identified. The carpal tunnel was opened distally and a hemostat placed underneath it to protect the underlying structures releases carried up proximally proximal to the wrist flexion crease there appeared to be compression and there was narrowing at that level with some deformity to the nerve. After release there is good vascular washed to the nerve and it appeared that compression had been relieved. The wound was then irrigated through no masses within the carpal tunnel the wound was infiltrated with 10 cc of half percent Sensorcaine for postop analgesia and the wound closed with simple interrupted 5-0 nylon skin suture. Going to the elbow with the elbow at 90 flexion a curvilinear incision was made over the lateral condyle. Hemostasis was checked electrocautery the  extensor origin was elevated volarly and dorsally to provide exposure the upper condyle there was devitalized tissue present this was debrided with use of rongeur and the lateral epicondyle was decorticated until there is bleeding bone. When this was smoothed and it appeared to have adequate deep bleeding the wound was irrigated and the tendon volar and dorsal were repaired but not reattached to the 6 with 2-0 Vicryl. 4-0 nylon was used for the skin and a sterile dressing of Xeroform 4 x 4 web roll was applied same dressing to the wrist with Ace wraps also applied at this point tourniquet was let down the patient isn't center comes stable condition  PLAN OF CARE: Discharge to home after PACU  PATIENT DISPOSITION:  PACU - hemodynamically stable.

## 2016-06-18 NOTE — Discharge Instructions (Signed)
AMBULATORY SURGERY  DISCHARGE INSTRUCTIONS   1) The drugs that you were given will stay in your system until tomorrow so for the next 24 hours you should not:  A) Drive an automobile B) Make any legal decisions C) Drink any alcoholic beverage   2) You may resume regular meals tomorrow.  Today it is better to start with liquids and gradually work up to solid foods.   3) Please notify your doctor immediately if you have any unusual bleeding, trouble breathing, redness and pain at the surgery site, drainage, fever, or pain not relieved by medication.   Please contact your physician with any problems or Same Day Surgery at [phone], Monday through Friday 6 am to 4 pm, or [place] at [facility] number at [phone].   Carpal Tunnel Release Carpal tunnel release is a surgical procedure to relieve numbness and pain in your hand that are caused by carpal tunnel syndrome. Your carpal tunnel is a narrow, hollow space in your wrist. It passes between your wrist bones and a band of connective tissue (transverse carpal ligament). The nerve that supplies most of your hand (median nerve) passes through this space, and so do the connections between your fingers and the muscles of your arm (tendons). Carpal tunnel syndrome makes this space swell and become narrow, and this causes pain and numbness. In carpal tunnel release surgery, a surgeon cuts through the transverse carpal ligament to make more room in the carpal tunnel space. You may have this surgery if other types of treatment have not worked. LET [facility] CARE PROVIDER KNOW ABOUT:  Any allergies you have.  All medicines you are taking, including vitamins, herbs, eye drops, creams, and over-the-counter medicines.  Previous problems you or members of your family have had with the use of anesthetics.  Any blood disorders you have.  Previous surgeries you have had.  Medical conditions you have. RISKS AND COMPLICATIONS Generally,  this is a safe procedure. However, problems may occur, including:  Bleeding.  Infection.  Injury to the median nerve.  Need for additional surgery. BEFORE THE PROCEDURE  Ask your health care provider about:  Changing or stopping your regular medicines. This is especially important if you are taking diabetes medicines or blood thinners.  Taking medicines such as aspirin and ibuprofen. These medicines can thin your blood. Do not take these medicines before your procedure if your health care provider instructs you not to.  Do not eat or drink anything after midnight on the night before the procedure or as directed by your health care provider.  Plan to have someone take you home after the procedure. PROCEDURE  An IV tube may be inserted into a vein.  You will be given one of the following:  A medicine that numbs the wrist area (local anesthetic). You may also be given a medicine to make you relax (sedative).  A medicine that makes you go to sleep (general anesthetic).  Your arm, hand, and wrist will be cleaned with a germ-killing solution (antiseptic).  Your surgeon will make a surgical cut (incision) over the palm side of your wrist. The surgeon will pull aside the skin of your wrist to expose the carpal tunnel space.  The surgeon will cut the transverse carpal ligament.  The edges of the incision will be closed with stitches (sutures) or staples.  A bandage (dressing) will be placed over your wrist and wrapped around your hand and wrist. AFTER THE PROCEDURE  You may spend some time in  a recovery area.  Your blood pressure, heart rate, breathing rate, and blood oxygen level will be monitored often until the medicines you were given have worn off.  You will likely have some pain. You will be given pain medicine.  You may need to wear a splint or a wrist brace over your dressing.   This information is not intended to replace advice given to you by your health care  provider. Make sure you discuss any questions you have with your health care provider.   Document Released: 01/01/2004 Document Revised: 11/01/2014 Document Reviewed: 05/29/2014 Elsevier Interactive Patient Education 2016 Elsevier Inc.  Carpal Tunnel Release, Care After Refer to this sheet in the next few weeks. These instructions provide you with information about caring for yourself after your procedure. Your health care provider may also give you more specific instructions. Your treatment has been planned according to current medical practices, but problems sometimes occur. Call your health care provider if you have any problems or questions after your procedure. WHAT TO EXPECT AFTER THE PROCEDURE After your procedure, it is typical to have the following:  Pain.  Numbness.  Tingling.  Swelling.  Stiffness.  Bruising. HOME CARE INSTRUCTIONS  Take medicines only as directed by your health care provider.  There are many different ways to close and cover an incision, including stitches (sutures), skin glue, and adhesive strips. Follow your health care provider's instructions about:  Incision care.  Bandage (dressing) changes and removal.  Incision closure removal.  Wear a splint or a brace as directed by your surgeon. You may need to do this for 2-3 weeks.  Keep your hand raised (elevated) above the level of your heart while you are resting. Move your fingers often.  Avoid activities that cause hand pain.  Ask your surgeon when you can start to do all of your usual activities again, such as:  Driving.  Returning to work.  Bathing and swimming.  Keep all follow-up visits as directed by your health care provider. This is important. You may need physical therapy for several months to speed healing and regain movement. SEEK MEDICAL CARE IF:  You have drainage, redness, swelling, or pain at your incision site.  You have a fever.  You have chills.  Your pain medicine  is not working.  Your symptoms do not go away after 2 months.  Your symptoms go away and then return. SEEK IMMEDIATE MEDICAL CARE IF:  You have pain or numbness that is getting worse.  Your fingers change color.  You are not able to move your fingers.   This information is not intended to replace advice given to you by your health care provider. Make sure you discuss any questions you have with your health care provider.   Document Released: 04/30/2005 Document Revised: 11/01/2014 Document Reviewed: 05/29/2014 Elsevier Interactive Patient Education 2016 [employer].   Tennis Elbow Tennis elbow (lateral epicondylitis) is inflammation of the outer tendons of your forearm close to your elbow. Your tendons attach your muscles to your bones. The outer tendons of your forearm are used to extend your wrist, and they attach on the outside part of your elbow. Tennis elbow is often found in people who play tennis, but anyone may get the condition from repeatedly extending the wrist or turning the forearm. CAUSES This condition is caused by repeatedly extending your wrist and using your hands. It can result from sports or work that requires repetitive forearm movements. Tennis elbow may also be caused by an  injury. RISK FACTORS You have a higher risk of developing tennis elbow if you play tennis or another racquet sport. You also have a higher risk if you frequently use your hands for work. This condition is also more likely to develop in:  Musicians.  Carpenters, painters, and plumbers.  Cooks.  Cashiers.  People who work in [employer]ories.  Holiday representativeConstruction workers.  Butchers.  People who use computers. SYMPTOMS Symptoms of this condition include:  Pain and tenderness in your forearm and the outer part of your elbow. You may only feel the pain when you use your arm, or you may feel it even when you are not using your arm.  A burning feeling that runs from your elbow through your  arm.  Weak grip in your hands. DIAGNOSIS  This condition may be diagnosed by medical history and physical exam. You may also have other tests, including:  X-rays.  MRI. TREATMENT Your health care provider will recommend lifestyle adjustments, such as resting and icing your arm. Treatment may also include:  Medicines for inflammation. This may include shots of cortisone if your pain continues.  Physical therapy. This may include massage or exercises.  An elbow brace. Surgery may eventually be recommended if your pain does not go away with treatment. HOME CARE INSTRUCTIONS Activity  Rest your elbow and wrist as directed by your health care provider. Try to avoid any activities that caused the problem until your health care provider says that you can do them again.  If a physical therapist teaches you exercises, do all of them as directed.  If you lift an object, lift it with your palm facing upward. This lowers the stress on your elbow. Lifestyle  If your tennis elbow is caused by sports, check your equipment and make sure that:  You are using it correctly.  It is the best fit for you.  If your tennis elbow is caused by work, take breaks frequently, if you are able. Talk with your manager about how to best perform tasks in a way that is safe.  If your tennis elbow is caused by computer use, talk with your manager about any changes that can be made to your work environment. General Instructions  If directed, apply ice to the painful area:  Put ice in a plastic bag.  Place a towel between your skin and the bag.  Leave the ice on for 20 minutes, 2-3 times per day.  Take medicines only as directed by your health care provider.  If you were given a brace, wear it as directed by your health care provider.  Keep all follow-up visits as directed by your health care provider. This is important. SEEK MEDICAL CARE IF:  Your pain does not get better with treatment.  Your  pain gets worse.  You have numbness or weakness in your forearm, hand, or fingers.   This information is not intended to replace advice given to you by your health care provider. Make sure you discuss any questions you have with your health care provider.   Document Released: 10/11/2005 Document Revised: 02/25/2015 Document Reviewed: 10/07/2014 Elsevier Interactive Patient Education [employer]2016 Elsevier Inc.

## 2016-06-18 NOTE — Anesthesia Preprocedure Evaluation (Addendum)
Anesthesia Evaluation  Patient identified by MRN, date of birth, ID band Patient awake    Reviewed: Allergy & Precautions, NPO status , Patient's Chart, lab work & pertinent test results  Airway Mallampati: II  TM Distance: >3 FB     Dental  (+) Lower Dentures, Upper Dentures   Pulmonary Current Smoker,    Pulmonary exam normal        Cardiovascular negative cardio ROS Normal cardiovascular exam     Neuro/Psych PSYCHIATRIC DISORDERS Anxiety Depression negative neurological ROS     GI/Hepatic negative GI ROS, (+)     substance abuse  alcohol use, Hepatitis -, C  Endo/Other  negative endocrine ROS  Renal/GU negative Renal ROS  negative genitourinary   Musculoskeletal  (+) Arthritis , Osteoarthritis,    Abdominal Normal abdominal exam  (+)   Peds negative pediatric ROS (+)  Hematology negative hematology ROS (+)   Anesthesia Other Findings   Reproductive/Obstetrics                            Anesthesia Physical Anesthesia Plan  ASA: III  Anesthesia Plan: General   Post-op Pain Management:    Induction: Intravenous  Airway Management Planned: LMA  Additional Equipment:   Intra-op Plan:   Post-operative Plan: Extubation in OR  Informed Consent: I have reviewed the patients History and Physical, chart, labs and discussed the procedure including the risks, benefits and alternatives for the proposed anesthesia with the patient or authorized representative who has indicated his/her understanding and acceptance.   Dental advisory given  Plan Discussed with: CRNA and Surgeon  Anesthesia Plan Comments:         Anesthesia Quick Evaluation

## 2016-06-18 NOTE — Anesthesia Procedure Notes (Signed)
Procedure Name: LMA Insertion Date/Time: 06/18/2016 11:45 AM Performed by: Michaele OfferSAVAGE, Johnnisha Forton Pre-anesthesia Checklist: Patient identified, Emergency Drugs available, Suction available, Patient being monitored and Timeout performed Patient Re-evaluated:Patient Re-evaluated prior to inductionOxygen Delivery Method: Circle system utilized Preoxygenation: Pre-oxygenation with 100% oxygen Intubation Type: IV induction Ventilation: Mask ventilation without difficulty LMA: LMA inserted LMA Size: 4.0 Number of attempts: 1 Placement Confirmation: positive ETCO2 and breath sounds checked- equal and bilateral Tube secured with: Tape Dental Injury: Teeth and Oropharynx as per pre-operative assessment

## 2016-06-18 NOTE — Transfer of Care (Signed)
Immediate Anesthesia Transfer of Care Note  Patient: Sharyn LullKenneth Schirtzinger  Procedure(s) Performed: Procedure(s): CARPAL TUNNEL RELEASE (Left) TENNIS ELBOW RELEASE/NIRSCHEL PROCEDURE (Left)  Patient Location: PACU  Anesthesia Type:General  Level of Consciousness: awake, alert , oriented and patient cooperative  Airway & Oxygen Therapy: Patient Spontanous Breathing and Patient connected to face mask oxygen  Post-op Assessment: Report given to RN, Post -op Vital signs reviewed and stable and Patient moving all extremities X 4  Post vital signs: Reviewed and stable  Last Vitals:  Vitals:   06/18/16 0956  BP: 116/80  Pulse: 67  Resp: 16  Temp: 36.6 C    Last Pain:  Vitals:   06/18/16 0956  TempSrc: Oral  PainSc: 8          Complications: No apparent anesthesia complications

## 2016-06-18 NOTE — Anesthesia Postprocedure Evaluation (Addendum)
Anesthesia Post Note  Patient: Donald Hensley  Procedure(s) Performed: Procedure(s) (LRB): CARPAL TUNNEL RELEASE (Left) TENNIS ELBOW RELEASE/NIRSCHEL PROCEDURE (Left)  Patient location during evaluation: PACU Anesthesia Type: General Level of consciousness: awake and alert and oriented Pain management: pain level controlled Vital Signs Assessment: post-procedure vital signs reviewed and stable Respiratory status: spontaneous breathing Cardiovascular status: blood pressure returned to baseline Anesthetic complications: no    Last Vitals:  Vitals:   06/18/16 1312 06/18/16 1313  BP:  130/89  Pulse: 88 84  Resp: 20 16  Temp:      Last Pain:  Vitals:   06/18/16 1313  TempSrc:   PainSc: 0-No pain                 Alishba Naples

## 2016-06-18 NOTE — Progress Notes (Signed)
Spoke with dr. Noralyn Pickarroll about UDS ordered. Patients admits to having a positive UDS several years ago (late teens, early 4120s), but it was not cocaine and patient denies any drug use in few years. Ok with Dr. Noralyn Pickarroll not to do UDS today per this information and due to fact that vital signs are well within limits today. UDS D/Ced.
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
